# Patient Record
Sex: Male | Born: 1950 | Race: White | Hispanic: No | Marital: Married | Smoking: Never smoker
Health system: Southern US, Community
[De-identification: ages and names within clinical notes are randomized; demographics above are authoritative.]

## PROBLEM LIST (undated history)

## (undated) ENCOUNTER — Ambulatory Visit: Admission: EM | Payer: Medicare HMO

## (undated) DIAGNOSIS — E785 Hyperlipidemia, unspecified: Secondary | ICD-10-CM

## (undated) HISTORY — PX: BACK SURGERY: SHX140

---

## 2014-10-15 ENCOUNTER — Encounter: Payer: Self-pay | Admitting: Emergency Medicine

## 2014-10-15 ENCOUNTER — Emergency Department (INDEPENDENT_AMBULATORY_CARE_PROVIDER_SITE_OTHER): Payer: PRIVATE HEALTH INSURANCE

## 2014-10-15 ENCOUNTER — Emergency Department
Admission: EM | Admit: 2014-10-15 | Discharge: 2014-10-15 | Disposition: A | Discharge status: Home / Self Care | Payer: PRIVATE HEALTH INSURANCE | Source: Home / Self Care | Attending: Emergency Medicine | Admitting: Emergency Medicine

## 2014-10-15 DIAGNOSIS — M79641 Pain in right hand: Secondary | ICD-10-CM | POA: Diagnosis not present

## 2014-10-15 DIAGNOSIS — M79643 Pain in unspecified hand: Secondary | ICD-10-CM

## 2014-10-15 DIAGNOSIS — M778 Other enthesopathies, not elsewhere classified: Secondary | ICD-10-CM

## 2014-10-15 DIAGNOSIS — M25531 Pain in right wrist: Secondary | ICD-10-CM | POA: Diagnosis not present

## 2014-10-15 DIAGNOSIS — M6588 Other synovitis and tenosynovitis, other site: Secondary | ICD-10-CM | POA: Diagnosis not present

## 2014-10-15 DIAGNOSIS — M779 Enthesopathy, unspecified: Secondary | ICD-10-CM

## 2014-10-15 DIAGNOSIS — S63501A Unspecified sprain of right wrist, initial encounter: Secondary | ICD-10-CM

## 2014-10-15 MED ORDER — PREDNISONE 20 MG PO TABS: 20.0000 mg | ORAL_TABLET | Freq: Two times a day (BID) | ORAL | Status: DC

## 2014-10-15 NOTE — ED Provider Notes (Signed)
CSN: [id number]     Arrival date & time 10/15/14  1055 History   First MD Initiated Contact with Patient 10/15/14 1100     Chief Complaint  Patient presents with  . Hand Pain    Patient is a 64 y.o. male presenting with hand pain. The history is provided by the patient.  Hand Pain This is a new problem. The problem occurs constantly. The problem has been gradually worsening. Pertinent negatives include no chest pain, no abdominal pain, no headaches and no shortness of breath. The symptoms are aggravated by bending. The symptoms are relieved by rest. He has tried nothing for the symptoms. The treatment provided no relief.   Right hand and wrist pain x 2 weeks after shoveling asphalt and using a sledgehammer at his house, hurts from his thumb and radiates up into his arm, not getting better, getting worse. Pain is sharp, 4 out of 10 at rest, 6 out of 10 with movement and pressing on it. Feels radiating to right mid forearm but not any more proximal than that. No numbness or weakness. Denies prior injuries to the area.  History reviewed. No pertinent past medical history. History reviewed. No pertinent past surgical history. Family History  Problem Relation Age of Onset  . Heart attack Mother    History  Substance Use Topics  . Smoking status: Never Smoker   . Smokeless tobacco: Not on file  . Alcohol Use: Yes    Review of Systems  Respiratory: Negative for shortness of breath.   Cardiovascular: Negative for chest pain.  Gastrointestinal: Negative for abdominal pain.  Neurological: Negative for headaches.  All other systems reviewed and are negative.   Allergies  Review of patient's allergies indicates no known allergies.  Home Medications   Prior to Admission medications   Medication Sig Start Date End Date Taking? Authorizing Provider  esomeprazole (NEXIUM) 10 MG packet Take 10 mg by mouth daily before breakfast.   Yes Historical Provider, MD  predniSONE (DELTASONE) 20 MG  tablet Take 1 tablet (20 mg total) by mouth 2 (two) times daily with a meal. X 5 days 10/15/14   Lajean Manes, MD   BP 123/86 mmHg  Pulse 86  Temp(Src) 97.9 F (36.6 C) (Oral)  Ht  (1.702 m)  Wt 167 lb (75.751 kg)  BMI 26.15 kg/m2  SpO2 98% Physical Exam  Constitutional: He is oriented to person, place, and time. He appears well-developed and well-nourished. No distress.  HENT:  Head: Normocephalic and atraumatic.  Eyes: Conjunctivae and EOM are normal. Pupils are equal, round, and reactive to light. No scleral icterus.  Neck: Normal range of motion.  Cardiovascular: Normal rate.   Pulmonary/Chest: Effort normal.  Abdominal: He exhibits no distension.  Musculoskeletal:       Right wrist: He exhibits decreased range of motion, tenderness, bony tenderness (distal radius and navicular.) and swelling (Mild). He exhibits no laceration.       Right hand: He exhibits decreased range of motion, tenderness, bony tenderness (From first MCP J to carpal-metacarpal joint area) and swelling. He exhibits no laceration. Normal sensation noted. Normal strength noted.       Hands: Pain exacerbated by abduction of right thumb. Tendons intact.  Neurological: He is alert and oriented to person, place, and time.  Skin: Skin is warm.  Psychiatric: He has a normal mood and affect.  Nursing note and vitals reviewed.   ED Course  Procedures (including critical care time) Labs Review Labs Reviewed - No  data to display  Imaging Review Dg Wrist Complete Right  10/15/2014   CLINICAL DATA:  Injury 2 weeks ago.  Pain.  Initial evaluation.  EXAM: RIGHT WRIST - COMPLETE 3+ VIEW  COMPARISON:  None.  FINDINGS: No acute bony or joint abnormality. No evidence of fracture or dislocation. Diffuse osteopenia. Diffuse mild degenerative change. Degenerative changes most prominent about the radiocarpal and first carpometacarpal joints. Subchondral cyst noted in the triquetrum, most likely degenerative.  IMPRESSION: No  acute abnormality.  Diffuse degenerative change.   Electronically Signed   By: Maisie Fushomas  Register   On: 10/15/2014 12:45   Dg Hand Complete Right  10/15/2014   CLINICAL DATA:  Right hand pain after shoveling and using sledgehammer 2 weeks ago. Initial encounter.  EXAM: RIGHT HAND - COMPLETE 3+ VIEW  COMPARISON:  None.  FINDINGS: There is no evidence of fracture or dislocation. There is no evidence of arthropathy or other focal bone abnormality. Soft tissues are unremarkable.  IMPRESSION: Normal right hand.   Electronically Signed   By: Lupita RaiderJames  Green Jr, M.D.   On: 10/15/2014 12:43     MDM   1. Wrist pain, acute, right   2. Hand pain   3. Right wrist sprain, initial encounter   4. Tendinitis of right hand    X-rays right hand and right wrist show no fracture or dislocation. There is diffuse osteopenia and diffuse mild degenerative change, with most prominent degenerative changes around the radiocarpal and first carpometacarpal joints. There is a subchondral cyst noted in the triquetrum, radiologist feels this is most likely degenerative, with no acute abnormality. Reviewed the above with patient. Treatment options discussed, as well as risks, benefits, alternatives. Patient voiced understanding and agreement with the following plans: Ibuprofen when necessary pain. He declined prescription pain medicine. Prescribed prednisone 20 mg twice a day 5 days. Precautions discussed. Right spica wrist/hand splint applied.-Advised to wear this for next 7 days. Printed a note excusing from work today. May return to work tomorrow but no lifting over 25 pounds. No heavy gripping with right hand or wrist. Follow-up with your primary care doctor or orthopedist in 5-7 days if not improving, or sooner if symptoms become worse. Precautions discussed. Red flags discussed. Questions invited and answered. Patient voiced understanding and agreement.    Lajean Manesavid Massey, MD 10/15/14 1304

## 2014-10-15 NOTE — ED Notes (Signed)
Right hand pain x 2 weeks after shoveling asphalt at his house, hurts from his thumb and radiates up into his arm, not getting better, getting worse.

## 2016-02-16 IMAGING — CR DG WRIST COMPLETE 3+V*R*
4 series · 4 of 4 positions shown · non-contrast
Comparison: None.

CLINICAL DATA: Injury 2 weeks ago.  Pain.  Initial evaluation.

EXAM:
RIGHT WRIST - COMPLETE 3+ VIEW

[wrist pa]
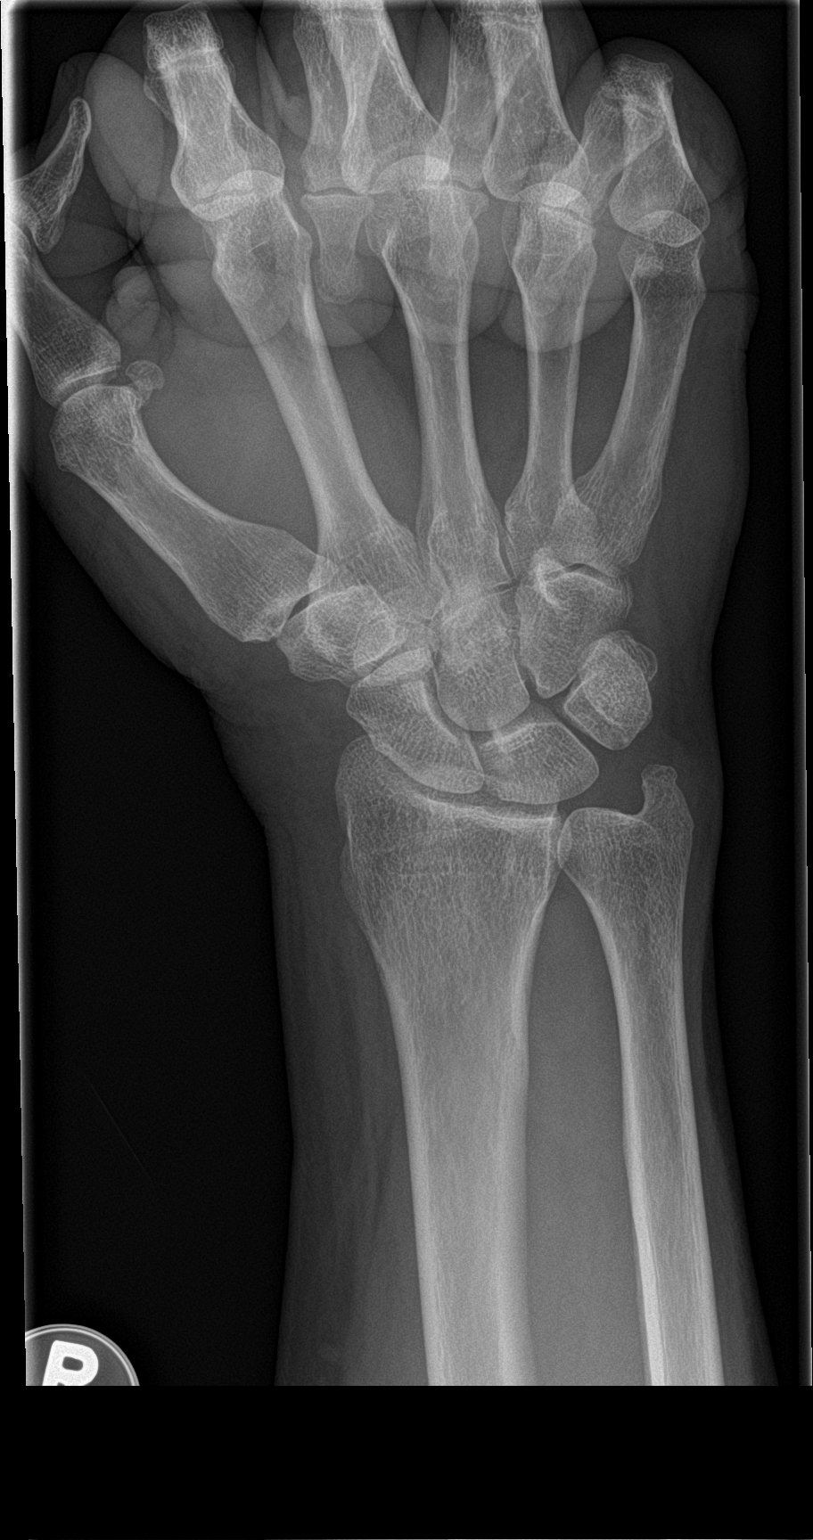

[wrist obl]
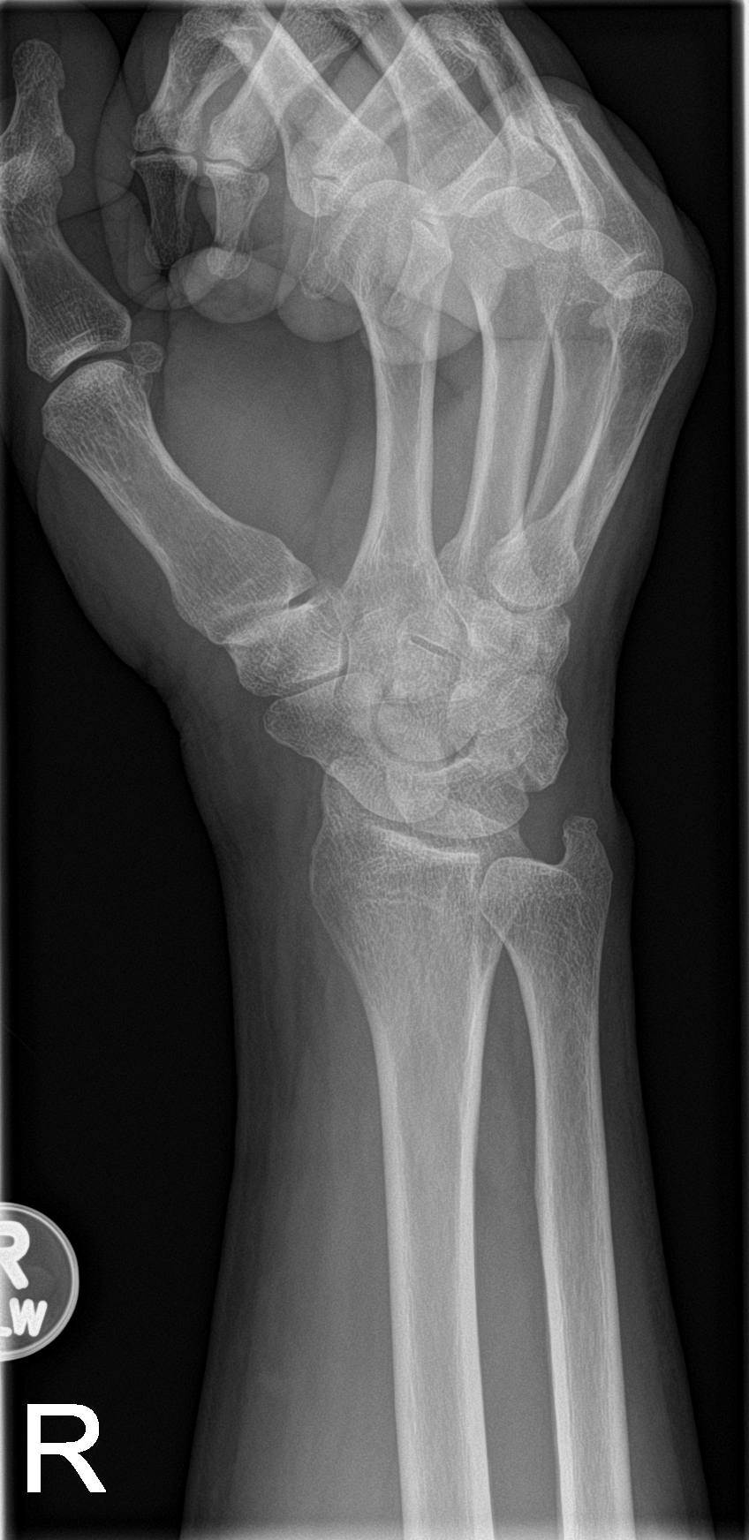

[wrist lat]
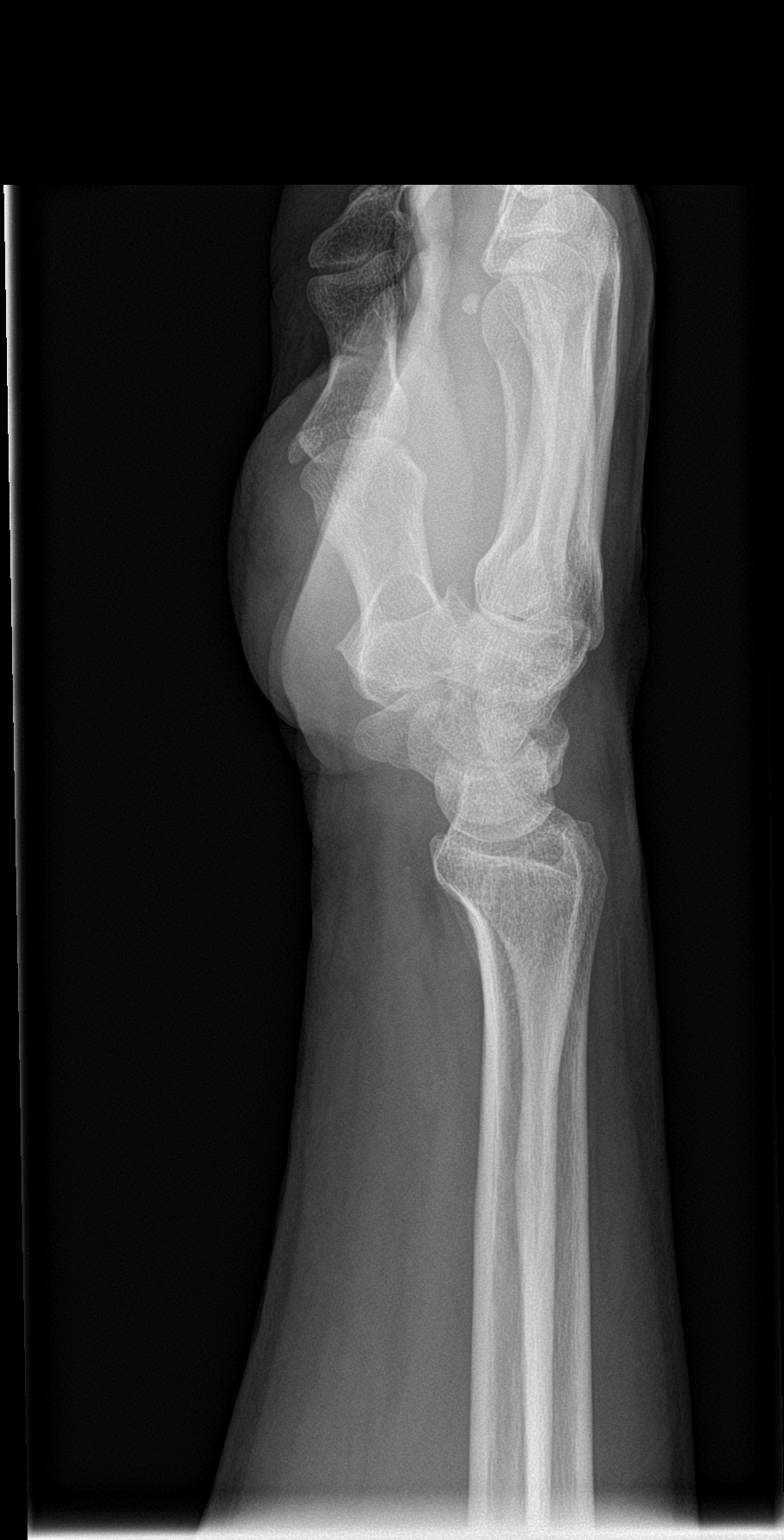

[wrist navicular]
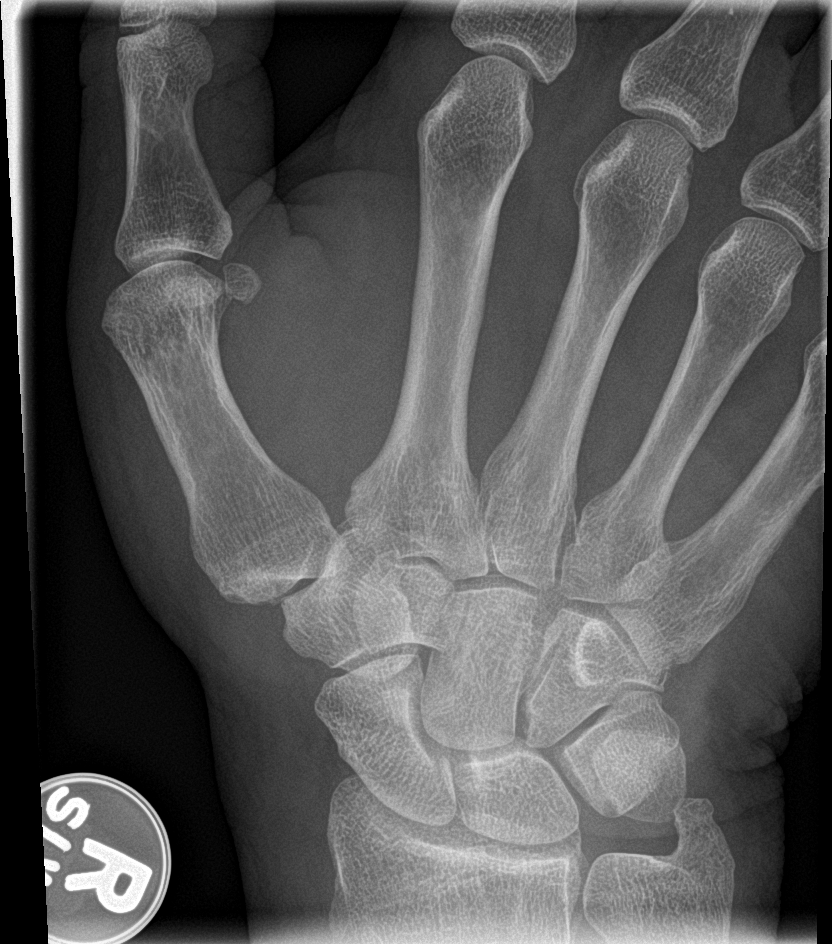

[4 of 4 positions shown; findings below may reference images not displayed]

FINDINGS: No acute bony or joint abnormality. No evidence of fracture or
dislocation. Diffuse osteopenia. Diffuse mild degenerative change.
Degenerative changes most prominent about the radiocarpal and first
carpometacarpal joints. Subchondral cyst noted in the triquetrum,
most likely degenerative.
IMPRESSION: No acute abnormality.  Diffuse degenerative change.

## 2016-02-16 IMAGING — CR DG HAND COMPLETE 3+V*R*
3 series · 3 of 3 positions shown · non-contrast
Comparison: None.

CLINICAL DATA: Right hand pain after shoveling and using
sledgehammer 2 weeks ago. Initial encounter.

EXAM:
RIGHT HAND - COMPLETE 3+ VIEW

[hand pa]
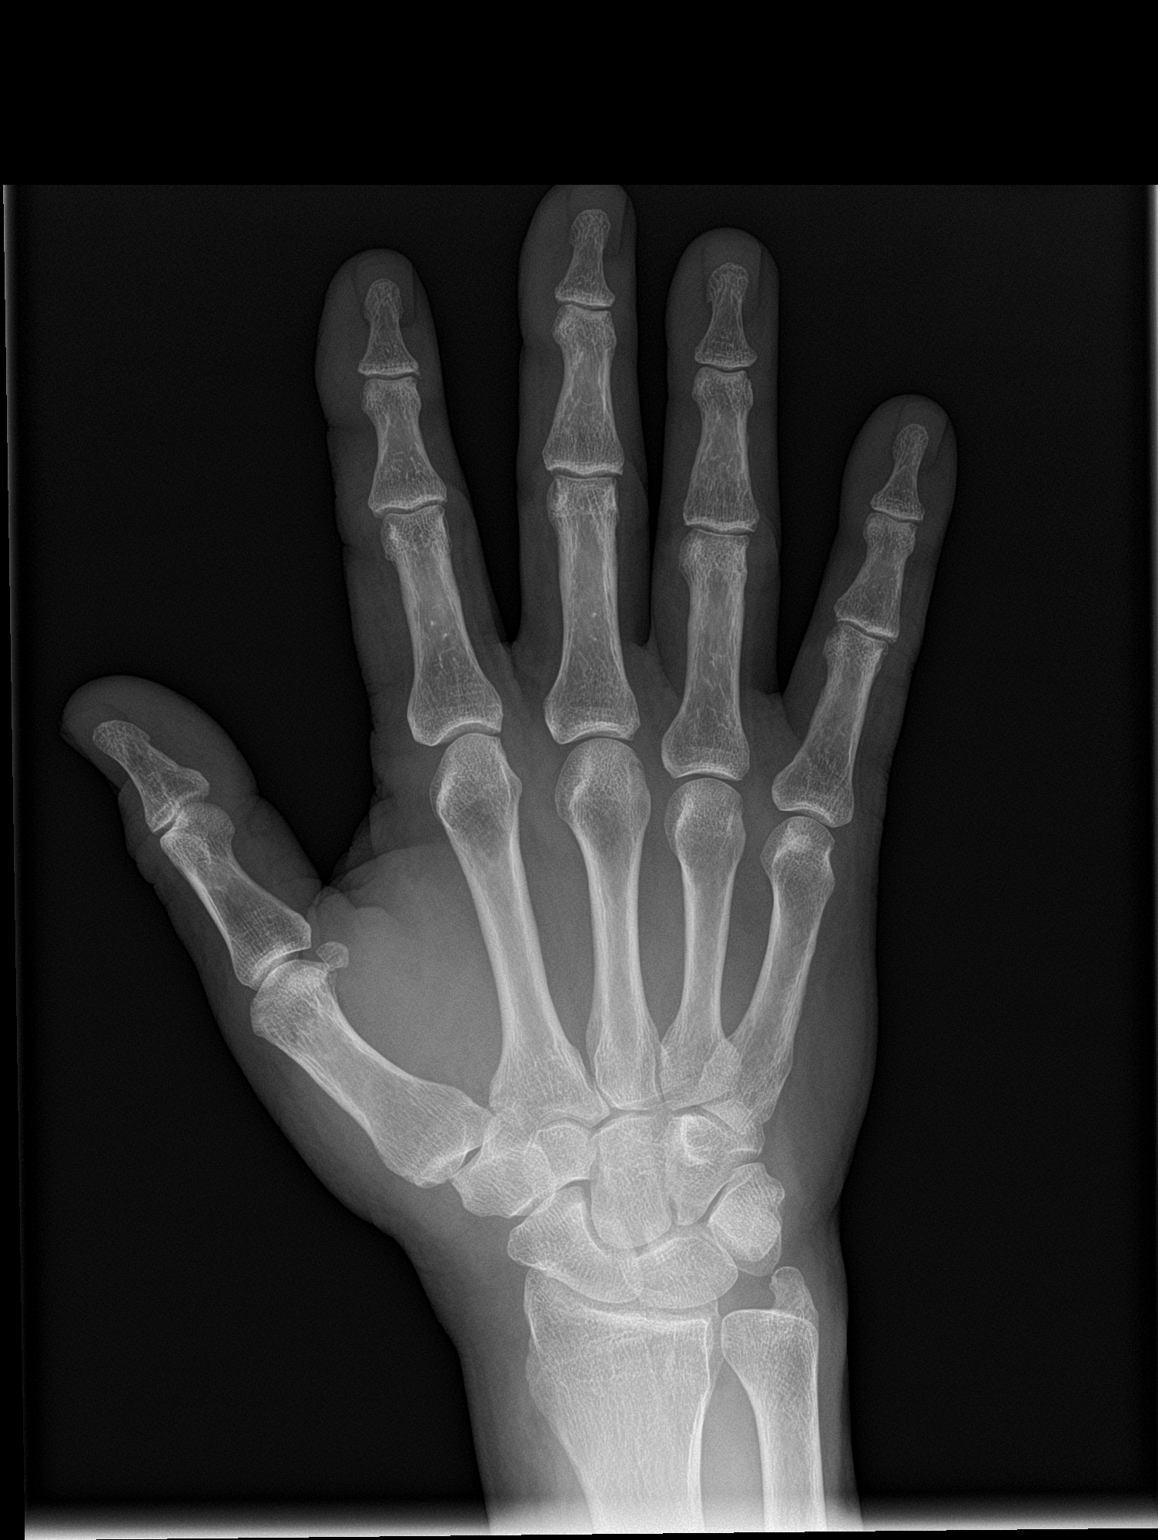

[hand obl]
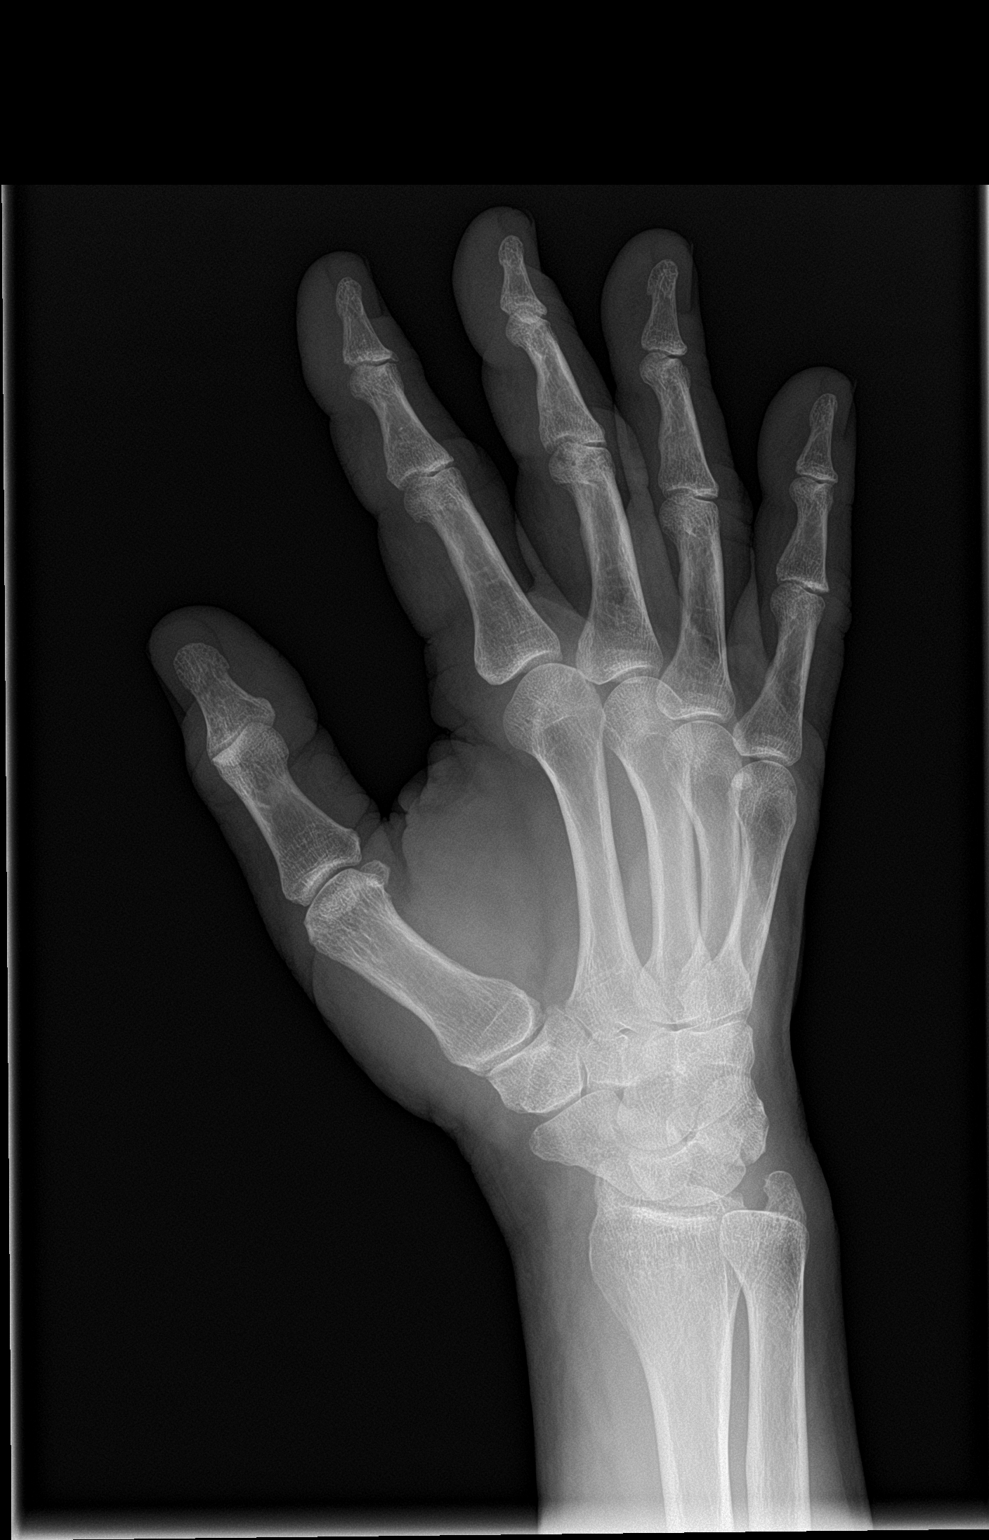

[hand lat]
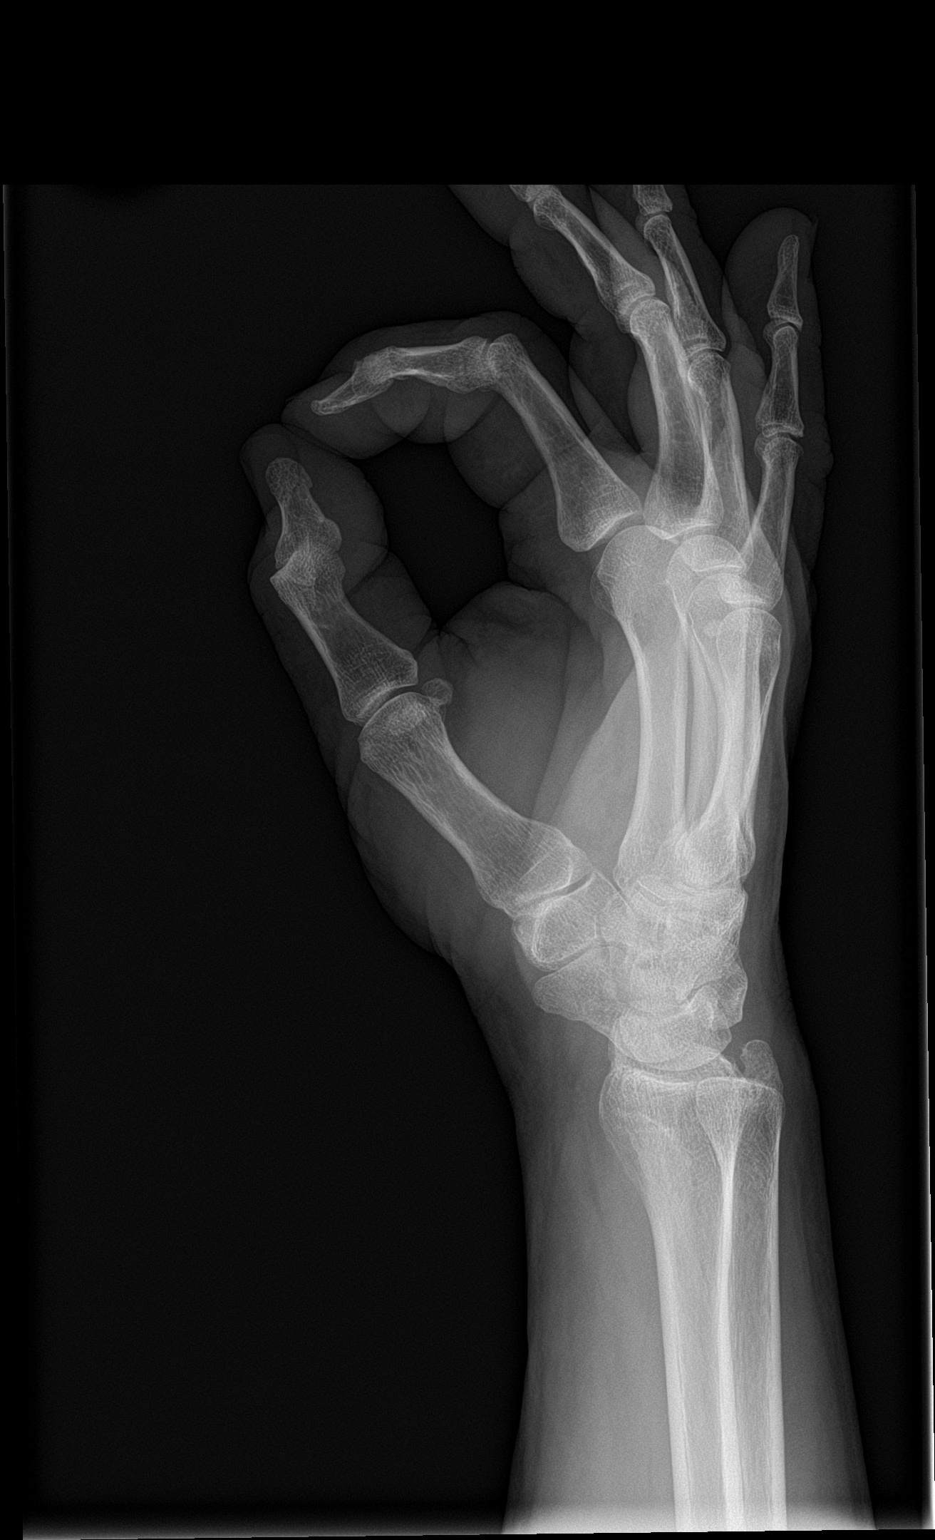

[3 of 3 positions shown; findings below may reference images not displayed]

FINDINGS: There is no evidence of fracture or dislocation. There is no
evidence of arthropathy or other focal bone abnormality. Soft
tissues are unremarkable.
IMPRESSION: Normal right hand.

## 2018-06-15 ENCOUNTER — Other Ambulatory Visit: Payer: Self-pay

## 2018-06-15 ENCOUNTER — Emergency Department (INDEPENDENT_AMBULATORY_CARE_PROVIDER_SITE_OTHER)
Admission: EM | Admit: 2018-06-15 | Discharge: 2018-06-15 | Disposition: A | Discharge status: Home / Self Care | Payer: PRIVATE HEALTH INSURANCE | Source: Home / Self Care

## 2018-06-15 ENCOUNTER — Encounter: Payer: Self-pay | Admitting: Emergency Medicine

## 2018-06-15 DIAGNOSIS — K047 Periapical abscess without sinus: Secondary | ICD-10-CM

## 2018-06-15 DIAGNOSIS — K0889 Other specified disorders of teeth and supporting structures: Secondary | ICD-10-CM

## 2018-06-15 HISTORY — DX: Hyperlipidemia, unspecified: E78.5

## 2018-06-15 MED ORDER — HYDROCODONE-ACETAMINOPHEN 5-325 MG PO TABS: 1.0000 | ORAL_TABLET | ORAL | 0 refills | Status: AC | PRN

## 2018-06-15 MED ORDER — CEFTRIAXONE SODIUM 1 G IJ SOLR
1000.0000 mg | Freq: Once | INTRAMUSCULAR | Status: AC
  Administered 2018-06-15: 1000 mg via INTRAMUSCULAR

## 2018-06-15 MED ORDER — CLINDAMYCIN HCL 150 MG PO CAPS: 150.0000 mg | ORAL_CAPSULE | Freq: Four times a day (QID) | ORAL | 0 refills | Status: DC

## 2018-06-15 NOTE — Discharge Instructions (Addendum)
Take the clindamycin 150 mg 1 pill 4 times daily for dental abscess for 1 week.  If you get gastrointestinal symptoms, especially diarrhea or bloody diarrhea, you should stop the medicine immediately and get back to us.  Take the Norco (hydrocodone) pain pills 1 pill every 4-6 hours as needed for severe pain.  Take ibuprofen 200 mg 3 pills every 6 or 8 hours if needed for additional pain relief or if the pain is not so severe that you need the hydrocodone.  Try to get in with a dentist as soon as possible  If unable to get in with a dentist over the holidays and the abscess is coming more to a head with more swelling, return here and we will see if it can be drained.  If severely worse at any time with fevers and generalized illness associated with this go to the emergency room.

## 2018-06-15 NOTE — ED Provider Notes (Signed)
[facility]    CSN: [id number] Arrival date & time: 06/15/18  0902     History   Chief Complaint Chief Complaint  Patient presents with  . Dental Pain    HPI Elijah Clements is a 67 y.o. male.   HPI 67 year old male, generally healthy, works at [employer].  He had a little pain in his tooth a week or 2 ago.  Today he started having bad tooth pain overnight in the left front teeth upper.  He has been to a dentist in a long time.  2 dentist were not available that he tried to contact today.  Pain goes up beside the left nares. Past Medical History:  Diagnosis Date  . Hyperlipidemia     There are no active problems to display for this patient.   No past surgical history on file.     Home Medications    Prior to Admission medications   Medication Sig Start Date End Date Taking? Authorizing Provider  atorvastatin (LIPITOR) 40 MG tablet Take 40 mg by mouth daily.   Yes [provider]  clindamycin (CLEOCIN) 150 MG capsule Take 1 capsule (150 mg total) by mouth every 6 (six) hours. 06/15/18   Peyton NajjarHopper, Karon Cotterill H, MD  esomeprazole (NEXIUM) 10 MG packet Take 10 mg by mouth daily before breakfast.    [provider]  HYDROcodone-acetaminophen (NORCO) 5-325 MG tablet Take 1 tablet by mouth every 4 (four) hours as needed for up to 5 days. 06/15/18 06/20/18  Peyton NajjarHopper, Miku Udall H, MD  predniSONE (DELTASONE) 20 MG tablet Take 1 tablet (20 mg total) by mouth 2 (two) times daily with a meal. X 5 days 10/15/14   Lajean ManesMassey, Ovidio Steele, MD    Family History Family History  Problem Relation Age of Onset  . Heart attack Mother     Social History Social History   Tobacco Use  . Smoking status: Never Smoker  Substance Use Topics  . Alcohol use: Yes  . Drug use: Not on file     Allergies   Patient has no known allergies.   Review of Systems Review of Systems No other major symptoms.  Physical Exam Triage Vital Signs ED Triage Vitals  Enc Vitals Group     BP      Pulse      Resp      Temp      Temp src      SpO2      Weight      Height      Head Circumference      Peak Flow      Pain Score      Pain Loc      Pain Edu?      Excl. in GC?    No data found.  Updated Vital Signs BP 126/86 (BP Location: Right Arm)   Pulse 72   Temp 98.1 F (36.7 C) (Oral)   Resp 16   Ht 5\' 5"  (1.651 m)   Wt 83.9 kg   SpO2 97%   BMI 30.79 kg/m   Visual Acuity Right Eye Distance:   Left Eye Distance:   Bilateral Distance:    Right Eye Near:   Left Eye Near:    Bilateral Near:     Physical Exam Left nasolabial fold is a little swollen.  He is very tender in that area.  No external erythema.  The gum is very red left upper trough.  He has multiple caries and some broken off  stubs.  Very tender to touch, indurated deep, not coming to a head yet.  Throat looks clear.  Neck supple without nodes.  UC Treatments / Results  Labs (all labs ordered are listed, but only abnormal results are displayed) Labs Reviewed - No data to display  EKG None  Radiology No results found.  Procedures Procedures (including critical care time)  Medications Ordered in UC Medications  cefTRIAXone (ROCEPHIN) injection 1,000 mg (1,000 mg Intramuscular Given 06/15/18 0940)    Initial Impression / Assessment and Plan / UC Course  I have reviewed the triage vital signs and the nursing notes.  Pertinent labs & imaging results that were available during my care of the patient were reviewed by me and considered in my medical decision making (see chart for details).     Dental abscess with facial pain.  Not to the point that I can comfortably do an I&D.  Will give a shot of ceftriaxone and place him on clindamycin. Final Clinical Impressions(s) / UC Diagnoses   Final diagnoses:  Dental abscess  Pain, dental     Discharge Instructions     Take the clindamycin 150 mg 1 pill 4 times daily for dental abscess for 1 week.  If you get gastrointestinal symptoms,  especially diarrhea or bloody diarrhea, you should stop the medicine immediately and get back to us.  Take the Norco (hydrocodone) pain pills 1 pill every 4-6 hours as needed for severe pain.  Take ibuprofen 200 mg 3 pills every 6 or 8 hours if needed for additional pain relief or if the pain is not so severe that you need the hydrocodone.  Try to get in with a dentist as soon as possible  If unable to get in with a dentist over the holidays and the abscess is coming more to a head with more swelling, return here and we will see if it can be drained.  If severely worse at any time with fevers and generalized illness associated with this go to the emergency room.    ED Prescriptions    Medication Sig Dispense Auth. Provider   clindamycin (CLEOCIN) 150 MG capsule Take 1 capsule (150 mg total) by mouth every 6 (six) hours. 28 capsule Peyton NajjarHopper, Gloristine Turrubiates H, MD   HYDROcodone-acetaminophen (NORCO) 5-325 MG tablet Take 1 tablet by mouth every 4 (four) hours as needed for up to 5 days. 30 tablet Peyton NajjarHopper, Derrick Tiegs H, MD     Controlled Substance Prescriptions [place] Controlled Substance Registry consulted? No   Peyton NajjarHopper, Reyhan Moronta H, MD 06/15/18 [phone]

## 2018-06-15 NOTE — ED Triage Notes (Signed)
Patient reports awakening with pain in upper left dental area during last night; took acetaminophen at 0400; does not currently have a dentist.

## 2021-11-04 LAB — COLOGUARD: COLOGUARD: NEGATIVE

## 2022-10-30 ENCOUNTER — Ambulatory Visit
Admission: EM | Admit: 2022-10-30 | Discharge: 2022-10-30 | Disposition: A | Discharge status: Home / Self Care | Payer: Medicare HMO | Attending: Emergency Medicine | Admitting: Emergency Medicine

## 2022-10-30 ENCOUNTER — Encounter: Payer: Self-pay | Admitting: Emergency Medicine

## 2022-10-30 ENCOUNTER — Ambulatory Visit (INDEPENDENT_AMBULATORY_CARE_PROVIDER_SITE_OTHER): Payer: Medicare HMO

## 2022-10-30 DIAGNOSIS — M542 Cervicalgia: Secondary | ICD-10-CM | POA: Diagnosis not present

## 2022-10-30 DIAGNOSIS — M4722 Other spondylosis with radiculopathy, cervical region: Secondary | ICD-10-CM

## 2022-10-30 DIAGNOSIS — M5412 Radiculopathy, cervical region: Secondary | ICD-10-CM | POA: Diagnosis not present

## 2022-10-30 MED ORDER — NAPROXEN 375 MG PO TABS: 375.0000 mg | ORAL_TABLET | Freq: Two times a day (BID) | ORAL | 0 refills | Status: DC

## 2022-10-30 MED ORDER — PREDNISONE 20 MG PO TABS: 40.0000 mg | ORAL_TABLET | Freq: Every day | ORAL | 0 refills | Status: AC

## 2022-10-30 MED ORDER — IBUPROFEN 400 MG PO TABS
400.0000 mg | ORAL_TABLET | Freq: Once | ORAL | Status: AC
  Administered 2022-10-30: 400 mg via ORAL

## 2022-10-30 MED ORDER — ACETAMINOPHEN 500 MG PO TABS
1000.0000 mg | ORAL_TABLET | Freq: Once | ORAL | Status: AC
  Administered 2022-10-30: 1000 mg via ORAL

## 2022-10-30 MED ORDER — TIZANIDINE HCL 4 MG PO TABS: 4.0000 mg | ORAL_TABLET | Freq: Three times a day (TID) | ORAL | 0 refills | Status: DC | PRN

## 2022-10-30 NOTE — Discharge Instructions (Addendum)
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  Take Naprosyn with 1000 mg of Tylenol twice a day.  You may take an additional 1000 mg of Tylenol 1 more times a day.  Finish prednisone and Zanaflex.  Continue heat.  Please follow-up with orthopedics if you are not feeling better after finishing the prednisone.  Go to the ER for the signs and symptoms were discussed.

## 2022-10-30 NOTE — ED Triage Notes (Signed)
Patient c/o possible pinched nerve in his neck that radiates down right arm to hand, causing numbness in right index finger x 4 days.  No apparent injury.  Patient has taken Tylenol and [facility].

## 2022-10-30 NOTE — ED Provider Notes (Signed)
HPI  SUBJECTIVE:  Elijah Clements is a 72 y.o. male who presents with 4 days of right sided trapezial/neck pain radiating along his upper back, down to his elbow.  The pain is maximal in the trapezial area.  No fall, trauma, repetitive activity.  He reports numbness in his index finger.  No tingling, arm, shoulder, neck weakness.  No chest pain, shortness of breath.  No limitation of motion of the shoulder/neck.  No rash, fevers, headaches, slurred speech.  States that this feels similar to previous pinched nerves that he has had in his back.  He tried IcyHot, Biofreeze, Tylenol and heat.  The heat helps.  The pain is aggravated with turning his head to the right.  He has a past medical history of hypercholesterolemia and osteoporosis for which he is currently being treated.  PCP: [facility].    Past Medical History:  Diagnosis Date   Hyperlipidemia     History reviewed. No pertinent surgical history.  Family History  Problem Relation Age of Onset   Heart attack Mother     Social History   Tobacco Use   Smoking status: Never   Smokeless tobacco: Never  Vaping Use   Vaping Use: Never used  Substance Use Topics   Alcohol use: Yes   Drug use: Never    No current facility-administered medications for this encounter.  Current Outpatient Medications:    atorvastatin (LIPITOR) 40 MG tablet, Take 40 mg by mouth daily., Disp: , Rfl:    esomeprazole (NEXIUM) 10 MG packet, Take 10 mg by mouth daily before breakfast., Disp: , Rfl:    metFORMIN (GLUCOPHAGE) 500 MG tablet, Take by mouth., Disp: , Rfl:    predniSONE (DELTASONE) 20 MG tablet, Take 2 tablets (40 mg total) by mouth daily with breakfast for 5 days., Disp: 10 tablet, Rfl: 0   tiZANidine (ZANAFLEX) 4 MG tablet, Take 1 tablet (4 mg total) by mouth every 8 (eight) hours as needed for muscle spasms., Disp: 30 tablet, Rfl: 0   naproxen (NAPROSYN) 375 MG tablet, Take 1 tablet (375 mg total) by mouth 2 (two) times daily., Disp: 30 tablet, Rfl:  0  No Known Allergies   ROS  As noted in HPI.   Physical Exam  BP (!) 156/96 (BP Location: Left Arm)   Pulse 73   Temp 98.4 F (36.9 C) (Oral)   Resp 18   Ht 5\' 7"  (1.702 m)   Wt 85.3 kg   SpO2 95%   BMI 29.44 kg/m   Constitutional: Well developed, well nourished, no acute distress Eyes:  EOMI, conjunctiva normal bilaterally HENT: Normocephalic, atraumatic,mucus membranes moist Neck: No C-spine tenderness.  Positive right-sided trapezial tenderness, tightness.  Right shoulder, upper arm, elbow nontender.  Motor, sensation intact and equal in the median/radial/ulnar distribution.  Pain with rotation lateral bending of head to the right.  No limitation of motion.  RP 2+ Respiratory: Normal inspiratory effort Cardiovascular: Normal rate GI: nondistended skin: No rash over trapezius or arm, skin intact Musculoskeletal: no deformities Neurologic: Alert & oriented x 3, no focal neuro deficits Psychiatric: Speech and behavior appropriate   ED Course   Medications  acetaminophen (TYLENOL) tablet 1,000 mg (1,000 mg Oral Given 10/30/22 0910)  ibuprofen (ADVIL) tablet 400 mg (400 mg Oral Given 10/30/22 0910)    Orders Placed This Encounter  Procedures   DG Cervical Spine Complete    Standing Status:   Standing    Number of Occurrences:   1    Order Specific  Question:   Reason for Exam (SYMPTOM  OR DIAGNOSIS REQUIRED)    Answer:   Right sided neck pain with radicular symptoms history of osteoporosis.  Rule out compression fracture,  acute changes.    No results found for this or any previous visit (from the past 24 hour(s)). DG Cervical Spine Complete  Result Date: 10/30/2022 CLINICAL DATA:  Right-sided neck pain with radicular symptoms into the right upper extremity. History of osteoporosis. EXAM: CERVICAL SPINE - COMPLETE 4+ VIEW COMPARISON:  None Available. FINDINGS: There is straightening of the normal cervical lordosis with grade 1 anterolisthesis of C3 on C4, likely  degenerative given advanced facet arthrosis at this level which is asymmetrically severe on the right. No acute fracture is identified. Advanced disc space narrowing is present at C4-5 and C5-6 with associated degenerative endplate changes. Milder disc degeneration is noted at C3-4 and C6-7. Multilevel osseous neural foraminal stenosis is evident bilaterally, most notable on the right at C3-4, C4-5, and C5-6 and on the left at C4-5, C5-6, and C6-7. The prevertebral soft tissues are within normal limits. IMPRESSION: 1. Advanced cervical disc and facet degeneration. 2. No acute osseous abnormality. Electronically Signed   By: Sebastian Ache M.D.   On: 10/30/2022 09:17    ED Clinical Impression  1. Osteoarthritis of spine with radiculopathy, cervical region   2. Musculoskeletal neck pain      ED Assessment/Plan     Outside records reviewed.  Patient has a past medical history of osteoporosis confirmed by DEXA scan in May 2023.  He is at high risk for fractures.  Last creatinine clearance done 11/2021 96 mL/min.  Patient was given 400 mg of ibuprofen combined with 1000 mg of Tylenol here for pain.  Presentation most consistent with musculoskeletal cause-arthritis with reticular symptoms.  Doubt stroke, ACS.  No evidence of shingles today.  Will check C-spine as he has a history of osteoporosis to rule out pathologic fracture or any acute changes.  If unremarkable, plan to send home with Naprosyn 375 mg, with Tylenol twice a day, Zanaflex, prednisone 40 mg for 5 days.  Follow-up with orthopedics if not feeling better in a week.  ER return precautions given.  Reviewed imaging independently.  advanced narrowed disc space in C4-C6.  Facet arthrosis C3 on C4, severe on the right.  No acute changes.  See radiology report for full details.  Discussed  imaging, MDM, treatment plan, and plan for follow-up with patient. Discussed sn/sx that should prompt return to the ED. patient agrees with plan.   Meds  ordered this encounter  Medications   tiZANidine (ZANAFLEX) 4 MG tablet    Sig: Take 1 tablet (4 mg total) by mouth every 8 (eight) hours as needed for muscle spasms.    Dispense:  30 tablet    Refill:  0   DISCONTD: naproxen (NAPROSYN) 375 MG tablet    Sig: Take 1 tablet (375 mg total) by mouth 2 (two) times daily.    Dispense:  30 tablet    Refill:  0   predniSONE (DELTASONE) 20 MG tablet    Sig: Take 2 tablets (40 mg total) by mouth daily with breakfast for 5 days.    Dispense:  10 tablet    Refill:  0   naproxen (NAPROSYN) 375 MG tablet    Sig: Take 1 tablet (375 mg total) by mouth 2 (two) times daily.    Dispense:  30 tablet    Refill:  0   acetaminophen (TYLENOL) tablet  1,000 mg   ibuprofen (ADVIL) tablet 400 mg      *This clinic note was created using Scientist, clinical (histocompatibility and immunogenetics). Therefore, there may be occasional mistakes despite careful proofreading.  ?    Domenick Gong, MD 10/30/22 [phone]

## 2022-11-15 ENCOUNTER — Ambulatory Visit (INDEPENDENT_AMBULATORY_CARE_PROVIDER_SITE_OTHER): Payer: Medicare HMO | Admitting: Sports Medicine

## 2022-11-15 DIAGNOSIS — M5412 Radiculopathy, cervical region: Secondary | ICD-10-CM | POA: Diagnosis not present

## 2022-11-15 MED ORDER — NAPROXEN 500 MG PO TABS: 500.0000 mg | ORAL_TABLET | Freq: Two times a day (BID) | ORAL | 3 refills | Status: DC

## 2022-11-15 MED ORDER — GABAPENTIN 300 MG PO CAPS: ORAL_CAPSULE | ORAL | 3 refills | Status: DC

## 2022-11-15 NOTE — Progress Notes (Signed)
    Procedures performed today:    None.  Independent interpretation of notes and tests performed by another provider:   None.  Brief History, Exam, Impression, and Recommendations:    Cervical radiculopathy at C7, right This is a very pleasant 72 year old male, has a long history of axial neck pain, more recently radiating down the right arm to the second and third fingers consistent with a right C7 radiculitis. He was seen in urgent care, prescribed some steroids which helped the neck pain, continues to have numbness in the second and third fingers. Explained the natural history and anatomy, exam is normal, no weakness. We will add home physical therapy, refill his naproxen and add Neurontin at night, discontinue the other medications, return to see me in approximately 6 weeks, MR for interventional planning if not better.    ____________________________________________ Ihor Austin. Benjamin Stain, M.D., ABFM., CAQSM., AME. Primary Care and Sports Medicine [facility]  Adjunct Professor of Family Medicine  [employer]  Restaurant manager, fast food

## 2022-11-15 NOTE — Assessment & Plan Note (Signed)
This is a very pleasant 72 year old male, has a long history of axial neck pain, more recently radiating down the right arm to the second and third fingers consistent with a right C7 radiculitis. He was seen in urgent care, prescribed some steroids which helped the neck pain, continues to have numbness in the second and third fingers. Explained the natural history and anatomy, exam is normal, no weakness. We will add home physical therapy, refill his naproxen and add Neurontin at night, discontinue the other medications, return to see me in approximately 6 weeks, MR for interventional planning if not better.

## 2022-12-27 ENCOUNTER — Ambulatory Visit (INDEPENDENT_AMBULATORY_CARE_PROVIDER_SITE_OTHER): Payer: Medicare HMO | Admitting: Sports Medicine

## 2022-12-27 DIAGNOSIS — M5412 Radiculopathy, cervical region: Secondary | ICD-10-CM

## 2022-12-27 NOTE — Assessment & Plan Note (Signed)
This very pleasant 72 year old male returns, he has a long history of axial neck pain more recently radiating down the right arm to the second and third fingers consistent with a right C7 radiculitis, he got some steroids in urgent care which seem to help, continue to have numbness, we talked about the anatomy and pathophysiology, added home physical therapy, refill naproxen and add naproxen nightly, he is not noticing much improvement with nightly naproxen so he will bump up to twice daily for a week and then 3 times daily, at this point he does not feel as though he is in enough discomfort to consider MRI/epidural, so we will have him slowly ramp up his gabapentin, continue home conditioning and we will revisit this in about 2 months.

## 2022-12-27 NOTE — Progress Notes (Signed)
    Procedures performed today:    None.  Independent interpretation of notes and tests performed by another provider:   None.  Brief History, Exam, Impression, and Recommendations:    Cervical radiculopathy at C7, right This very pleasant 72 year old male returns, he has a long history of axial neck pain more recently radiating down the right arm to the second and third fingers consistent with a right C7 radiculitis, he got some steroids in urgent care which seem to help, continue to have numbness, we talked about the anatomy and pathophysiology, added home physical therapy, refill naproxen and add naproxen nightly, he is not noticing much improvement with nightly naproxen so he will bump up to twice daily for a week and then 3 times daily, at this point he does not feel as though he is in enough discomfort to consider MRI/epidural, so we will have him slowly ramp up his gabapentin, continue home conditioning and we will revisit this in about 2 months.    ____________________________________________ Ihor Austin. Benjamin Stain, M.D., ABFM., CAQSM., AME. Primary Care and Sports Medicine [facility]  Adjunct Professor of Family Medicine  [employer] [facility] [employer] [facility] [employer]  Restaurant manager, [employer]

## 2023-01-29 ENCOUNTER — Ambulatory Visit: Payer: Medicare HMO | Admitting: Sports Medicine

## 2023-02-05 ENCOUNTER — Ambulatory Visit (INDEPENDENT_AMBULATORY_CARE_PROVIDER_SITE_OTHER): Payer: Medicare HMO | Admitting: Sports Medicine

## 2023-02-05 DIAGNOSIS — M5412 Radiculopathy, cervical region: Secondary | ICD-10-CM | POA: Diagnosis not present

## 2023-02-05 MED ORDER — NAPROXEN 500 MG PO TABS: 500.0000 mg | ORAL_TABLET | Freq: Two times a day (BID) | ORAL | 3 refills | Status: AC

## 2023-02-05 MED ORDER — GABAPENTIN 300 MG PO CAPS: ORAL_CAPSULE | ORAL | 3 refills | Status: AC

## 2023-02-05 NOTE — Assessment & Plan Note (Signed)
This very pleasant 72 year old male returns, he has a long history of axial neck pain, at the last visit it was radiating down the right arm to the second and third fingers consistent with a right C7 radiculitis, he did have some steroids in urgent care which seemed to help to some degree, he still had numbness, we went over the anatomy and pathophysiology, I added some home PT, I refilled his naproxen, we also ramped up gabapentin, currently taking naproxen twice a day and gabapentin twice a day, this seems to control his symptoms. He does have the option to do gabapentin 3 times daily, I will refill year supply of both, return to see me as needed.

## 2023-02-05 NOTE — Progress Notes (Signed)
    Procedures performed today:    None.  Independent interpretation of notes and tests performed by another provider:   None.  Brief History, Exam, Impression, and Recommendations:    Cervical radiculopathy at C7, right This very pleasant 72 year old male returns, he has a long history of axial neck pain, at the last visit it was radiating down the right arm to the second and third fingers consistent with a right C7 radiculitis, he did have some steroids in urgent care which seemed to help to some degree, he still had numbness, we went over the anatomy and pathophysiology, I added some home PT, I refilled his naproxen, we also ramped up gabapentin, currently taking naproxen twice a day and gabapentin twice a day, this seems to control his symptoms. He does have the option to do gabapentin 3 times daily, I will refill year supply of both, return to see me as needed.    ____________________________________________ Ihor Austin. Benjamin Stain, M.D., ABFM., CAQSM., AME. Primary Care and Sports Medicine [facility]  Adjunct Professor of Family Medicine  [place] [employer] [facility] [employer]  Restaurant manager, fast food

## 2023-12-27 ENCOUNTER — Ambulatory Visit: Admission: EM | Admit: 2023-12-27 | Discharge: 2023-12-27 | Disposition: A | Discharge status: Home / Self Care

## 2023-12-27 DIAGNOSIS — R030 Elevated blood-pressure reading, without diagnosis of hypertension: Secondary | ICD-10-CM | POA: Diagnosis not present

## 2023-12-27 NOTE — ED Triage Notes (Signed)
 Pt c/o high BP but not sure for how long. Says it read 151/87 this morning. Not currently on any BP neds. Last visit with PCP was in 130s per pt. Denies HA or blurred vision.

## 2023-12-27 NOTE — Discharge Instructions (Addendum)
 Advised patient to take blood pressure each morning after using bathroom and prior to eating breakfast.  Advised patient to log measurements for the next 10 to 14 days so that the primary care doctor can better understand daily blood pressure trends.  Encouraged increase daily water intake to 64 ounces per day 7 days/week.  Advised if blood pressure worsens and/or unresolved please follow-up with your PCP or here for further evaluation.

## 2023-12-27 NOTE — ED Provider Notes (Signed)
 [facility]    CSN: [id number] Arrival date & time: 12/27/23  1007      History   Chief Complaint Chief Complaint  Patient presents with   Hypertension    HPI Elijah Clements is a 73 y.o. male.   HPI Pleasant 73 year old male presents with concerns of elevated blood pressure.  Blood pressure today in triage is 156/97.  Patient was evaluated here in May 2024 with similar blood pressure measurement.  Patient denies headache and/or changes in visual acuity.  Patient reports he is followed by PCP currently.  Patient reports when having his annual physical in April of this year his blood pressure was in the low 130's over mid 80's. PMH significant for HLD, T2DM, and cervical radiculopathy.  Past Medical History:  Diagnosis Date   Hyperlipidemia     Patient Active Problem List   Diagnosis Date Noted   Cervical radiculopathy at C7, right 11/15/2022    History reviewed. No pertinent surgical history.     Home Medications    Prior to Admission medications   Medication Sig Start Date End Date Taking? Authorizing Provider  atorvastatin (LIPITOR) 40 MG tablet Take 40 mg by mouth daily.    [provider]  esomeprazole (NEXIUM) 10 MG packet Take 10 mg by mouth daily before breakfast.    [provider]  gabapentin  (NEURONTIN ) 300 MG capsule 1 capsule p.o. 2-3 times a day 02/05/23   Curtis Debby PARAS, MD  metFORMIN (GLUCOPHAGE) 500 MG tablet Take 1,000 mg by mouth. 10/12/22   [provider]  naproxen  (NAPROSYN ) 500 MG tablet Take 1 tablet (500 mg total) by mouth 2 (two) times daily with a meal. 02/05/23 02/05/24  Curtis Debby PARAS, MD    Family History Family History  Problem Relation Age of Onset   Heart attack Mother     Social History Social History   Tobacco Use   Smoking status: Never   Smokeless tobacco: Never  Vaping Use   Vaping status: Never Used  Substance Use Topics   Alcohol use: Yes   Drug use: Never      Allergies   Patient has no known allergies.   Review of Systems Review of Systems  Cardiovascular:        Concerned with elevated blood pressure     Physical Exam Triage Vital Signs ED Triage Vitals  Encounter Vitals Group     BP      Girls Systolic BP Percentile      Girls Diastolic BP Percentile      Boys Systolic BP Percentile      Boys Diastolic BP Percentile      Pulse      Resp      Temp      Temp src      SpO2      Weight      Height      Head Circumference      Peak Flow      Pain Score      Pain Loc      Pain Education      Exclude from Growth Chart    No data found.  Updated Vital Signs BP (!) 156/97 (BP Location: Right Arm)   Pulse 81   Temp 98 F (36.7 C) (Oral)   Resp 17   SpO2 93%   Physical Exam Vitals and nursing note reviewed.  Constitutional:      Appearance: Normal appearance. He is obese.  HENT:  Head: Normocephalic and atraumatic.     Mouth/Throat:     Mouth: Mucous membranes are moist.     Pharynx: Oropharynx is clear.  Eyes:     Extraocular Movements: Extraocular movements intact.     Conjunctiva/sclera: Conjunctivae normal.     Pupils: Pupils are equal, round, and reactive to light.  Cardiovascular:     Rate and Rhythm: Normal rate and regular rhythm.     Pulses: Normal pulses.     Heart sounds: Normal heart sounds.  Pulmonary:     Effort: Pulmonary effort is normal.     Breath sounds: Normal breath sounds. No wheezing, rhonchi or rales.  Musculoskeletal:        General: Normal range of motion.  Skin:    General: Skin is warm and dry.  Neurological:     General: No focal deficit present.     Mental Status: He is alert and oriented to person, place, and time. Mental status is at baseline.      UC Treatments / Results  Labs (all labs ordered are listed, but only abnormal results are displayed) Labs Reviewed - No data to display  EKG   Radiology No results found.  Procedures Procedures (including  critical care time)  Medications Ordered in UC Medications - No data to display  Initial Impression / Assessment and Plan / UC Course  I have reviewed the triage vital signs and the nursing notes.  Pertinent labs & imaging results that were available during my care of the patient were reviewed by me and considered in my medical decision making (see chart for details).     MDM: 1.  Elevated blood-pressure reading without diagnosis of hypertension-Advised patient to take blood pressure each morning after using bathroom and prior to eating breakfast.  Advised patient to log measurements for the next 10 to 14 days so that the primary care doctor can better understand daily blood pressure trends.  Encouraged increase daily water intake to 64 ounces per day 7 days/week.  Advised if blood pressure worsens and/or unresolved please follow-up with your PCP or here for further evaluation.  Patient discharged home, hemodynamically stable. Final Clinical Impressions(s) / UC Diagnoses   Final diagnoses:  Elevated blood pressure reading without diagnosis of hypertension     Discharge Instructions      Advised patient to take blood pressure each morning after using bathroom and prior to eating breakfast.  Advised patient to log measurements for the next 10 to 14 days so that the primary care doctor can better understand daily blood pressure trends.  Encouraged increase daily water intake to 64 ounces per day 7 days/week.  Advised if blood pressure worsens and/or unresolved please follow-up with your PCP or here for further evaluation.     ED Prescriptions   None    PDMP not reviewed this encounter.   Teddy Sharper, FNP 12/27/23 1101

## 2024-01-30 ENCOUNTER — Other Ambulatory Visit: Payer: Self-pay | Admitting: Sports Medicine

## 2024-01-30 ENCOUNTER — Telehealth: Payer: Self-pay

## 2024-01-30 DIAGNOSIS — M5412 Radiculopathy, cervical region: Secondary | ICD-10-CM

## 2024-01-30 NOTE — Telephone Encounter (Signed)
 Contacted patient to advise an appointment is needed for further refills of Naproxen . Patient stated Dr. Prentice Pinal is now managing Naproxen  refills and does not require Dr. ONEIDA for further management of Naproxen  at the moment.

## 2024-02-26 ENCOUNTER — Encounter: Payer: Self-pay | Admitting: Sports Medicine

## 2024-06-21 ENCOUNTER — Other Ambulatory Visit: Payer: Self-pay

## 2024-06-21 ENCOUNTER — Ambulatory Visit
Admission: EM | Admit: 2024-06-21 | Discharge: 2024-06-21 | Disposition: A | Discharge status: Home / Self Care | Attending: Family Medicine | Admitting: Family Medicine

## 2024-06-21 DIAGNOSIS — J069 Acute upper respiratory infection, unspecified: Secondary | ICD-10-CM

## 2024-06-21 MED ORDER — PREDNISONE 20 MG PO TABS: 40.0000 mg | ORAL_TABLET | Freq: Every day | ORAL | 0 refills | Status: AC

## 2024-06-21 MED ORDER — AZITHROMYCIN 250 MG PO TABS: 250.0000 mg | ORAL_TABLET | Freq: Every day | ORAL | 0 refills | Status: AC

## 2024-06-21 NOTE — ED Triage Notes (Signed)
 Patient c/o nasal congestion, a productive cough with white/clear sputum since yesterday. Patient denies any fever or sore throat.   Patient states he has been taking Nyquil Cold and flu.

## 2024-06-21 NOTE — ED Provider Notes (Signed)
 " [facility]    CSN: [id number] Arrival date & time: 06/21/24  0859      History   Chief Complaint Chief Complaint  Patient presents with   Nasal Congestion   Cough    HPI Elijah Clements is a 73 y.o. male.   HPI  Patient has nasal congestion cough sputum production fatigue since yesterday.  Declines flu and COVID tested.  No headache body ache fever or chills.  States I do not want to turn into bronchitis.  Apparently has had bronchitis before.  Non-smoker.  No underlying lung disease.  Past Medical History:  Diagnosis Date   Hyperlipidemia     Patient Active Problem List   Diagnosis Date Noted   Cervical radiculopathy at C7, right 11/15/2022    Past Surgical History:  Procedure Laterality Date   BACK SURGERY         Home Medications    Prior to Admission medications  Medication Sig Start Date End Date Taking? Authorizing Provider  azithromycin  (ZITHROMAX ) 250 MG tablet Take 1 tablet (250 mg total) by mouth daily. Take first 2 tablets together, then 1 every day until finished. 06/21/24  Yes Maranda Jamee Jacob, MD  predniSONE  (DELTASONE ) 20 MG tablet Take 2 tablets (40 mg total) by mouth daily with breakfast. 06/21/24  Yes Maranda Jamee Jacob, MD  atorvastatin (LIPITOR) 40 MG tablet Take 40 mg by mouth daily.    [provider]  esomeprazole (NEXIUM) 10 MG packet Take 10 mg by mouth daily before breakfast.    [provider]  gabapentin  (NEURONTIN ) 300 MG capsule 1 capsule p.o. 2-3 times a day 02/05/23   Curtis Debby PARAS, MD  metFORMIN (GLUCOPHAGE) 500 MG tablet Take 1,000 mg by mouth. 10/12/22   [provider]    Family History Family History  Problem Relation Age of Onset   Heart attack Mother     Social History Social History[1]   Allergies   Patient has no known allergies.   Review of Systems Review of Systems See HPI  Physical Exam Triage Vital Signs ED Triage Vitals  Encounter Vitals Group      BP 06/21/24 0915 (!) 144/99     Girls Systolic BP Percentile --      Girls Diastolic BP Percentile --      Boys Systolic BP Percentile --      Boys Diastolic BP Percentile --      Pulse Rate 06/21/24 0915 75     Resp 06/21/24 0915 16     Temp 06/21/24 0915 98.2 F (36.8 C)     Temp Source 06/21/24 0915 Oral     SpO2 06/21/24 0915 95 %     Weight --      Height --      Head Circumference --      Peak Flow --      Pain Score 06/21/24 0940 0     Pain Loc --      Pain Education --      Exclude from Growth Chart --    No data found.  Updated Vital Signs BP (!) 144/99 (BP Location: Right Arm)   Pulse 75   Temp 98.2 F (36.8 C) (Oral)   Resp 16   SpO2 95%   Physical Exam Constitutional:      General: He is not in acute distress.    Appearance: He is well-developed. He is ill-appearing.  HENT:     Head: Normocephalic and atraumatic.  Eyes:  Conjunctiva/sclera: Conjunctivae normal.     Pupils: Pupils are equal, round, and reactive to light.  Cardiovascular:     Rate and Rhythm: Normal rate.     Heart sounds: Normal heart sounds.  Pulmonary:     Effort: Pulmonary effort is normal. No respiratory distress.     Breath sounds: Rhonchi present.  Musculoskeletal:        General: Normal range of motion.     Cervical back: Normal range of motion.  Lymphadenopathy:     Cervical: No cervical adenopathy.  Skin:    General: Skin is warm and dry.  Neurological:     Mental Status: He is alert.      UC Treatments / Results  Labs (all labs ordered are listed, but only abnormal results are displayed) Labs Reviewed - No data to display  EKG   Radiology No results found.  Procedures Procedures (including critical care time)  Medications Ordered in UC Medications - No data to display  Initial Impression / Assessment and Plan / UC Course  I have reviewed the triage vital signs and the nursing notes.  Pertinent labs & imaging results that were available during my  care of the patient were reviewed by me and considered in my medical decision making (see chart for details).     Discussed that most of these upper respiratory infections are viral.  I encouraged him to take over-the-counter medicines, rest, fluids, stay home.  I did give him a written prescription for an antibiotic to take if he fails to see improvement in 7 days. Final Clinical Impressions(s) / UC Diagnoses   Final diagnoses:  Viral upper respiratory tract infection     Discharge Instructions      May continue your NyQuil/DayQuil for symptoms Tylenol  or ibuprofen  if you have pain and fever Drink lots of fluids Take the prednisone  daily.  This will help with your congestion If you fail to see improvement in a couple days fill the prescription for antibiotic azithromycin    ED Prescriptions     Medication Sig Dispense Auth. Provider   predniSONE  (DELTASONE ) 20 MG tablet Take 2 tablets (40 mg total) by mouth daily with breakfast. 10 tablet Maranda Jamee Jacob, MD   azithromycin  (ZITHROMAX ) 250 MG tablet Take 1 tablet (250 mg total) by mouth daily. Take first 2 tablets together, then 1 every day until finished. 6 tablet Maranda Jamee Jacob, MD      PDMP not reviewed this encounter.    [1]  Social History Tobacco Use   Smoking status: Never   Smokeless tobacco: Never  Vaping Use   Vaping status: Never Used  Substance Use Topics   Alcohol use: Yes   Drug use: Never     Maranda Jamee Jacob, MD 06/21/24 1623  "

## 2024-06-21 NOTE — Discharge Instructions (Signed)
 May continue your NyQuil/DayQuil for symptoms Tylenol  or ibuprofen  if you have pain and fever Drink lots of fluids Take the prednisone  daily.  This will help with your congestion If you fail to see improvement in a couple days fill the prescription for antibiotic azithromycin
